# Patient Record
Sex: Female | Born: 1955 | Race: White | Hispanic: No | Smoking: Former smoker
Health system: Southern US, Community
[De-identification: ages and names within clinical notes are randomized; demographics above are authoritative.]

## PROBLEM LIST (undated history)

## (undated) DIAGNOSIS — F419 Anxiety disorder, unspecified: Secondary | ICD-10-CM

## (undated) DIAGNOSIS — F329 Major depressive disorder, single episode, unspecified: Secondary | ICD-10-CM

## (undated) DIAGNOSIS — F32A Depression, unspecified: Secondary | ICD-10-CM

## (undated) HISTORY — DX: Depression, unspecified: F32.A

## (undated) HISTORY — DX: Major depressive disorder, single episode, unspecified: F32.9

## (undated) HISTORY — DX: Anxiety disorder, unspecified: F41.9

---

## 1998-07-01 ENCOUNTER — Other Ambulatory Visit: Admission: RE | Admit: 1998-07-01 | Discharge: 1998-07-01 | Payer: Self-pay | Admitting: Obstetrics and Gynecology

## 2000-02-19 ENCOUNTER — Other Ambulatory Visit: Admission: RE | Admit: 2000-02-19 | Discharge: 2000-02-19 | Payer: Self-pay | Admitting: Obstetrics and Gynecology

## 2001-04-18 ENCOUNTER — Other Ambulatory Visit: Admission: RE | Admit: 2001-04-18 | Discharge: 2001-04-18 | Payer: Self-pay | Admitting: Obstetrics and Gynecology

## 2002-08-10 ENCOUNTER — Other Ambulatory Visit: Admission: RE | Admit: 2002-08-10 | Discharge: 2002-08-10 | Payer: Self-pay | Admitting: Obstetrics and Gynecology

## 2003-08-26 ENCOUNTER — Other Ambulatory Visit: Admission: RE | Admit: 2003-08-26 | Discharge: 2003-08-26 | Payer: Self-pay | Admitting: Obstetrics and Gynecology

## 2006-06-27 ENCOUNTER — Other Ambulatory Visit: Admission: RE | Admit: 2006-06-27 | Discharge: 2006-06-27 | Payer: Self-pay | Admitting: Obstetrics & Gynecology

## 2007-07-03 ENCOUNTER — Other Ambulatory Visit: Admission: RE | Admit: 2007-07-03 | Discharge: 2007-07-03 | Payer: Self-pay | Admitting: Obstetrics & Gynecology

## 2007-11-14 ENCOUNTER — Emergency Department (HOSPITAL_COMMUNITY): Admission: EM | Admit: 2007-11-14 | Discharge: 2007-11-14 | Payer: Self-pay | Admitting: Emergency Medicine

## 2008-07-05 ENCOUNTER — Other Ambulatory Visit: Admission: RE | Admit: 2008-07-05 | Discharge: 2008-07-05 | Payer: Self-pay | Admitting: Internal Medicine

## 2011-05-17 ENCOUNTER — Ambulatory Visit (INDEPENDENT_AMBULATORY_CARE_PROVIDER_SITE_OTHER): Payer: BC Managed Care – PPO | Admitting: Licensed Clinical Social Worker

## 2011-05-17 DIAGNOSIS — F331 Major depressive disorder, recurrent, moderate: Secondary | ICD-10-CM

## 2011-05-17 DIAGNOSIS — F411 Generalized anxiety disorder: Secondary | ICD-10-CM

## 2011-05-24 ENCOUNTER — Ambulatory Visit (INDEPENDENT_AMBULATORY_CARE_PROVIDER_SITE_OTHER): Payer: BC Managed Care – PPO | Admitting: Licensed Clinical Social Worker

## 2011-05-24 DIAGNOSIS — F411 Generalized anxiety disorder: Secondary | ICD-10-CM

## 2011-05-24 DIAGNOSIS — F331 Major depressive disorder, recurrent, moderate: Secondary | ICD-10-CM

## 2011-05-31 ENCOUNTER — Ambulatory Visit (INDEPENDENT_AMBULATORY_CARE_PROVIDER_SITE_OTHER): Payer: BC Managed Care – PPO | Admitting: Licensed Clinical Social Worker

## 2011-05-31 DIAGNOSIS — F411 Generalized anxiety disorder: Secondary | ICD-10-CM

## 2011-05-31 DIAGNOSIS — F331 Major depressive disorder, recurrent, moderate: Secondary | ICD-10-CM

## 2011-06-14 ENCOUNTER — Ambulatory Visit (INDEPENDENT_AMBULATORY_CARE_PROVIDER_SITE_OTHER): Payer: BC Managed Care – PPO | Admitting: Licensed Clinical Social Worker

## 2011-06-14 DIAGNOSIS — F411 Generalized anxiety disorder: Secondary | ICD-10-CM

## 2011-06-14 DIAGNOSIS — F331 Major depressive disorder, recurrent, moderate: Secondary | ICD-10-CM

## 2011-06-21 ENCOUNTER — Ambulatory Visit: Payer: BC Managed Care – PPO | Admitting: Licensed Clinical Social Worker

## 2011-06-25 ENCOUNTER — Ambulatory Visit (INDEPENDENT_AMBULATORY_CARE_PROVIDER_SITE_OTHER): Payer: BC Managed Care – PPO | Admitting: Licensed Clinical Social Worker

## 2011-06-25 DIAGNOSIS — F331 Major depressive disorder, recurrent, moderate: Secondary | ICD-10-CM

## 2011-06-25 DIAGNOSIS — F411 Generalized anxiety disorder: Secondary | ICD-10-CM

## 2011-07-02 ENCOUNTER — Ambulatory Visit (INDEPENDENT_AMBULATORY_CARE_PROVIDER_SITE_OTHER): Payer: BC Managed Care – PPO | Admitting: Licensed Clinical Social Worker

## 2011-07-02 DIAGNOSIS — F411 Generalized anxiety disorder: Secondary | ICD-10-CM

## 2011-07-02 DIAGNOSIS — F331 Major depressive disorder, recurrent, moderate: Secondary | ICD-10-CM

## 2011-07-12 ENCOUNTER — Ambulatory Visit (INDEPENDENT_AMBULATORY_CARE_PROVIDER_SITE_OTHER): Payer: BC Managed Care – PPO | Admitting: Licensed Clinical Social Worker

## 2011-07-12 DIAGNOSIS — F411 Generalized anxiety disorder: Secondary | ICD-10-CM

## 2011-07-12 DIAGNOSIS — F331 Major depressive disorder, recurrent, moderate: Secondary | ICD-10-CM

## 2011-07-23 ENCOUNTER — Ambulatory Visit (INDEPENDENT_AMBULATORY_CARE_PROVIDER_SITE_OTHER): Payer: BC Managed Care – PPO | Admitting: Licensed Clinical Social Worker

## 2011-07-23 DIAGNOSIS — F331 Major depressive disorder, recurrent, moderate: Secondary | ICD-10-CM

## 2011-07-23 DIAGNOSIS — F411 Generalized anxiety disorder: Secondary | ICD-10-CM

## 2011-08-02 ENCOUNTER — Ambulatory Visit (INDEPENDENT_AMBULATORY_CARE_PROVIDER_SITE_OTHER): Payer: BC Managed Care – PPO | Admitting: Licensed Clinical Social Worker

## 2011-08-02 DIAGNOSIS — F331 Major depressive disorder, recurrent, moderate: Secondary | ICD-10-CM

## 2011-08-02 DIAGNOSIS — F411 Generalized anxiety disorder: Secondary | ICD-10-CM

## 2011-08-16 ENCOUNTER — Ambulatory Visit (INDEPENDENT_AMBULATORY_CARE_PROVIDER_SITE_OTHER): Payer: BC Managed Care – PPO | Admitting: Licensed Clinical Social Worker

## 2011-08-16 DIAGNOSIS — F331 Major depressive disorder, recurrent, moderate: Secondary | ICD-10-CM

## 2011-08-16 DIAGNOSIS — F411 Generalized anxiety disorder: Secondary | ICD-10-CM

## 2011-09-02 ENCOUNTER — Ambulatory Visit: Payer: BC Managed Care – PPO | Admitting: Licensed Clinical Social Worker

## 2011-09-02 DIAGNOSIS — F411 Generalized anxiety disorder: Secondary | ICD-10-CM

## 2011-09-02 DIAGNOSIS — F331 Major depressive disorder, recurrent, moderate: Secondary | ICD-10-CM

## 2011-09-08 ENCOUNTER — Ambulatory Visit (INDEPENDENT_AMBULATORY_CARE_PROVIDER_SITE_OTHER): Payer: BC Managed Care – PPO | Admitting: Licensed Clinical Social Worker

## 2011-09-08 DIAGNOSIS — F331 Major depressive disorder, recurrent, moderate: Secondary | ICD-10-CM

## 2011-09-08 DIAGNOSIS — F411 Generalized anxiety disorder: Secondary | ICD-10-CM

## 2011-09-13 LAB — BASIC METABOLIC PANEL
BUN: 8
CO2: 26
Calcium: 8.4
Chloride: 102
Creatinine, Ser: 0.8
GFR calc Af Amer: >60

## 2011-09-13 LAB — DIFFERENTIAL
Basophils Absolute: 0
Basophils Relative: 0
Eosinophils Absolute: 0 — ABNORMAL LOW
Neutro Abs: 3
Neutrophils Relative %: 74

## 2011-09-13 LAB — URINALYSIS, ROUTINE W REFLEX MICROSCOPIC
Hgb urine dipstick: NEGATIVE
Nitrite: NEGATIVE
Protein, ur: NEGATIVE
Specific Gravity, Urine: 1.009
Urobilinogen, UA: 1

## 2011-09-13 LAB — CBC
MCHC: 35.1
MCV: 94.3
Platelets: 121 — ABNORMAL LOW
RDW: 12.2

## 2011-09-28 ENCOUNTER — Ambulatory Visit (INDEPENDENT_AMBULATORY_CARE_PROVIDER_SITE_OTHER): Payer: BC Managed Care – PPO | Admitting: Licensed Clinical Social Worker

## 2011-09-28 DIAGNOSIS — F331 Major depressive disorder, recurrent, moderate: Secondary | ICD-10-CM

## 2011-09-28 DIAGNOSIS — F411 Generalized anxiety disorder: Secondary | ICD-10-CM

## 2011-10-15 ENCOUNTER — Ambulatory Visit (INDEPENDENT_AMBULATORY_CARE_PROVIDER_SITE_OTHER): Payer: BC Managed Care – PPO | Admitting: Licensed Clinical Social Worker

## 2011-10-15 DIAGNOSIS — F331 Major depressive disorder, recurrent, moderate: Secondary | ICD-10-CM

## 2011-10-15 DIAGNOSIS — F411 Generalized anxiety disorder: Secondary | ICD-10-CM

## 2011-10-21 ENCOUNTER — Ambulatory Visit: Payer: BC Managed Care – PPO | Admitting: Licensed Clinical Social Worker

## 2011-10-27 ENCOUNTER — Ambulatory Visit (INDEPENDENT_AMBULATORY_CARE_PROVIDER_SITE_OTHER): Payer: BC Managed Care – PPO | Admitting: Licensed Clinical Social Worker

## 2011-10-27 DIAGNOSIS — F331 Major depressive disorder, recurrent, moderate: Secondary | ICD-10-CM

## 2011-10-27 DIAGNOSIS — F411 Generalized anxiety disorder: Secondary | ICD-10-CM

## 2011-11-15 ENCOUNTER — Ambulatory Visit (INDEPENDENT_AMBULATORY_CARE_PROVIDER_SITE_OTHER): Payer: BC Managed Care – PPO | Admitting: Licensed Clinical Social Worker

## 2011-11-15 DIAGNOSIS — F331 Major depressive disorder, recurrent, moderate: Secondary | ICD-10-CM

## 2011-11-15 DIAGNOSIS — F411 Generalized anxiety disorder: Secondary | ICD-10-CM

## 2011-11-22 ENCOUNTER — Ambulatory Visit (INDEPENDENT_AMBULATORY_CARE_PROVIDER_SITE_OTHER): Payer: BC Managed Care – PPO | Admitting: Licensed Clinical Social Worker

## 2011-11-22 DIAGNOSIS — F411 Generalized anxiety disorder: Secondary | ICD-10-CM

## 2011-11-22 DIAGNOSIS — F331 Major depressive disorder, recurrent, moderate: Secondary | ICD-10-CM

## 2011-12-10 ENCOUNTER — Ambulatory Visit (INDEPENDENT_AMBULATORY_CARE_PROVIDER_SITE_OTHER): Payer: BC Managed Care – PPO | Admitting: Licensed Clinical Social Worker

## 2011-12-10 DIAGNOSIS — F411 Generalized anxiety disorder: Secondary | ICD-10-CM

## 2011-12-10 DIAGNOSIS — F331 Major depressive disorder, recurrent, moderate: Secondary | ICD-10-CM

## 2011-12-20 ENCOUNTER — Ambulatory Visit (INDEPENDENT_AMBULATORY_CARE_PROVIDER_SITE_OTHER): Payer: BC Managed Care – PPO | Admitting: Licensed Clinical Social Worker

## 2011-12-20 DIAGNOSIS — F331 Major depressive disorder, recurrent, moderate: Secondary | ICD-10-CM

## 2011-12-20 DIAGNOSIS — F411 Generalized anxiety disorder: Secondary | ICD-10-CM

## 2012-01-03 ENCOUNTER — Ambulatory Visit (INDEPENDENT_AMBULATORY_CARE_PROVIDER_SITE_OTHER): Payer: BC Managed Care – PPO | Admitting: Licensed Clinical Social Worker

## 2012-01-03 DIAGNOSIS — F411 Generalized anxiety disorder: Secondary | ICD-10-CM

## 2012-01-03 DIAGNOSIS — F331 Major depressive disorder, recurrent, moderate: Secondary | ICD-10-CM

## 2012-01-10 ENCOUNTER — Ambulatory Visit: Payer: BC Managed Care – PPO | Admitting: Licensed Clinical Social Worker

## 2012-01-17 ENCOUNTER — Ambulatory Visit (INDEPENDENT_AMBULATORY_CARE_PROVIDER_SITE_OTHER): Payer: BC Managed Care – PPO | Admitting: Licensed Clinical Social Worker

## 2012-01-17 DIAGNOSIS — F331 Major depressive disorder, recurrent, moderate: Secondary | ICD-10-CM

## 2012-01-17 DIAGNOSIS — F411 Generalized anxiety disorder: Secondary | ICD-10-CM

## 2012-01-31 ENCOUNTER — Ambulatory Visit (INDEPENDENT_AMBULATORY_CARE_PROVIDER_SITE_OTHER): Payer: BC Managed Care – PPO | Admitting: Licensed Clinical Social Worker

## 2012-01-31 DIAGNOSIS — F331 Major depressive disorder, recurrent, moderate: Secondary | ICD-10-CM

## 2012-01-31 DIAGNOSIS — F411 Generalized anxiety disorder: Secondary | ICD-10-CM

## 2012-02-28 ENCOUNTER — Ambulatory Visit (INDEPENDENT_AMBULATORY_CARE_PROVIDER_SITE_OTHER): Payer: BC Managed Care – PPO | Admitting: Licensed Clinical Social Worker

## 2012-02-28 DIAGNOSIS — F331 Major depressive disorder, recurrent, moderate: Secondary | ICD-10-CM

## 2012-02-28 DIAGNOSIS — F411 Generalized anxiety disorder: Secondary | ICD-10-CM

## 2012-03-20 ENCOUNTER — Ambulatory Visit (INDEPENDENT_AMBULATORY_CARE_PROVIDER_SITE_OTHER): Payer: BC Managed Care – PPO | Admitting: Licensed Clinical Social Worker

## 2012-03-20 DIAGNOSIS — F331 Major depressive disorder, recurrent, moderate: Secondary | ICD-10-CM

## 2012-03-20 DIAGNOSIS — F411 Generalized anxiety disorder: Secondary | ICD-10-CM

## 2012-05-29 ENCOUNTER — Ambulatory Visit (INDEPENDENT_AMBULATORY_CARE_PROVIDER_SITE_OTHER): Payer: BC Managed Care – PPO | Admitting: Licensed Clinical Social Worker

## 2012-05-29 DIAGNOSIS — F331 Major depressive disorder, recurrent, moderate: Secondary | ICD-10-CM

## 2012-07-03 ENCOUNTER — Ambulatory Visit (INDEPENDENT_AMBULATORY_CARE_PROVIDER_SITE_OTHER): Payer: BC Managed Care – PPO | Admitting: Licensed Clinical Social Worker

## 2012-07-03 DIAGNOSIS — F331 Major depressive disorder, recurrent, moderate: Secondary | ICD-10-CM

## 2012-07-03 DIAGNOSIS — F411 Generalized anxiety disorder: Secondary | ICD-10-CM

## 2012-07-31 ENCOUNTER — Ambulatory Visit (INDEPENDENT_AMBULATORY_CARE_PROVIDER_SITE_OTHER): Payer: BC Managed Care – PPO | Admitting: Licensed Clinical Social Worker

## 2012-07-31 DIAGNOSIS — F411 Generalized anxiety disorder: Secondary | ICD-10-CM

## 2012-07-31 DIAGNOSIS — F331 Major depressive disorder, recurrent, moderate: Secondary | ICD-10-CM

## 2012-09-04 ENCOUNTER — Ambulatory Visit: Payer: BC Managed Care – PPO | Admitting: Licensed Clinical Social Worker

## 2014-02-22 ENCOUNTER — Encounter: Payer: Self-pay | Admitting: Certified Nurse Midwife

## 2014-02-25 ENCOUNTER — Ambulatory Visit (INDEPENDENT_AMBULATORY_CARE_PROVIDER_SITE_OTHER): Payer: BC Managed Care – PPO | Admitting: Certified Nurse Midwife

## 2014-02-25 ENCOUNTER — Encounter: Payer: Self-pay | Admitting: Certified Nurse Midwife

## 2014-02-25 VITALS — BP 110/72 | HR 67 | Resp 16 | Ht 61.75 in | Wt 119.0 lb

## 2014-02-25 DIAGNOSIS — Z30432 Encounter for removal of intrauterine contraceptive device: Secondary | ICD-10-CM

## 2014-02-25 DIAGNOSIS — N951 Menopausal and female climacteric states: Secondary | ICD-10-CM

## 2014-02-25 DIAGNOSIS — F329 Major depressive disorder, single episode, unspecified: Secondary | ICD-10-CM

## 2014-02-25 DIAGNOSIS — F3289 Other specified depressive episodes: Secondary | ICD-10-CM

## 2014-02-25 DIAGNOSIS — F32A Depression, unspecified: Secondary | ICD-10-CM

## 2014-02-25 DIAGNOSIS — Z01419 Encounter for gynecological examination (general) (routine) without abnormal findings: Secondary | ICD-10-CM

## 2014-02-25 DIAGNOSIS — Z0000 Encounter for general adult medical examination without abnormal findings: Secondary | ICD-10-CM

## 2014-02-25 LAB — POCT URINALYSIS DIPSTICK
BILIRUBIN UA: NEGATIVE
GLUCOSE UA: NEGATIVE
Ketones, UA: NEGATIVE
Leukocytes, UA: NEGATIVE
NITRITE UA: NEGATIVE
Protein, UA: NEGATIVE
RBC UA: NEGATIVE
Urobilinogen, UA: NEGATIVE
pH, UA: 5

## 2014-02-25 NOTE — Patient Instructions (Signed)
EXERCISE AND DIET:  We recommended that you start or continue a regular exercise program for good health. Regular exercise means any activity that makes your heart beat faster and makes you sweat.  We recommend exercising at least 30 minutes per day at least 3 days a week, preferably 4 or 5.  We also recommend a diet low in fat and sugar.  Inactivity, poor dietary choices and obesity can cause diabetes, heart attack, stroke, and kidney damage, among others.    ALCOHOL AND SMOKING:  Women should limit their alcohol intake to no more than 7 drinks/beers/glasses of wine (combined, not each!) per week. Moderation of alcohol intake to this level decreases your risk of breast cancer and liver damage. And of course, no recreational drugs are part of a healthy lifestyle.  And absolutely no smoking or even second hand smoke. Most people know smoking can cause heart and lung diseases, but did you know it also contributes to weakening of your bones? Aging of your skin?  Yellowing of your teeth and nails?  CALCIUM AND VITAMIN D:  Adequate intake of calcium and Vitamin D are recommended.  The recommendations for exact amounts of these supplements seem to change often, but generally speaking 600 mg of calcium (either carbonate or citrate) and 800 units of Vitamin D per day seems prudent. Certain women may benefit from higher intake of Vitamin D.  If you are among these women, your doctor will have told you during your visit.    PAP SMEARS:  Pap smears, to check for cervical cancer or precancers,  have traditionally been done yearly, although recent scientific advances have shown that most women can have pap smears less often.  However, every woman still should have a physical exam from her gynecologist every year. It will include a breast check, inspection of the vulva and vagina to check for abnormal growths or skin changes, a visual exam of the cervix, and then an exam to evaluate the size and shape of the uterus and  ovaries.  And after 56 years of age, a rectal exam is indicated to check for rectal cancers. We will also provide age appropriate advice regarding health maintenance, like when you should have certain vaccines, screening for sexually transmitted diseases, bone density testing, colonoscopy, mammograms, etc.   MAMMOGRAMS:  All women over 40 years old should have a yearly mammogram. Many facilities now offer a "3D" mammogram, which may cost around $50 extra out of pocket. If possible,  we recommend you accept the option to have the 3D mammogram performed.  It both reduces the number of women who will be called back for extra views which then turn out to be normal, and it is better than the routine mammogram at detecting truly abnormal areas.    COLONOSCOPY:  Colonoscopy to screen for colon cancer is recommended for all women at age 50.  We know, you hate the idea of the prep.  We agree, BUT, having colon cancer and not knowing it is worse!!  Colon cancer so often starts as a polyp that can be seen and removed at colonscopy, which can quite literally save your life!  And if your first colonoscopy is normal and you have no family history of colon cancer, most women don't have to have it again for 10 years.  Once every ten years, you can do something that may end up saving your life, right?  We will be happy to help you get it scheduled when you are ready.    Be sure to check your insurance coverage so you understand how much it will cost.  It may be covered as a preventative service at no cost, but you should check your particular policy.     Depression, Adult Depression refers to feeling sad, low, down in the dumps, blue, gloomy, or empty. In general, there are two kinds of depression: Depression that we all experience from time to time because of upsetting life experiences, including the loss of a job or the ending of a relationship (normal sadness or normal grief). This kind of depression is considered normal,  is short lived, and resolves within a few days to 2 weeks. (Depression experienced after the loss of a loved one is called bereavement. Bereavement often lasts longer than 2 weeks but normally gets better with time.) Clinical depression, which lasts longer than normal sadness or normal grief or interferes with your ability to function at home, at work, and in school. It also interferes with your personal relationships. It affects almost every aspect of your life. Clinical depression is an illness. Symptoms of depression also can be caused by conditions other than normal sadness and grief or clinical depression. Examples of these conditions are listed as follows: Physical illness Some physical illnesses, including underactive thyroid gland (hypothyroidism), severe anemia, specific types of cancer, diabetes, uncontrolled seizures, heart and lung problems, strokes, and chronic pain are commonly associated with symptoms of depression. Side effects of some prescription medicine In some people, certain types of prescription medicine can cause symptoms of depression. Substance abuse Abuse of alcohol and illicit drugs can cause symptoms of depression. SYMPTOMS Symptoms of normal sadness and normal grief include the following: Feeling sad or crying for short periods of time. Not caring about anything (apathy). Difficulty sleeping or sleeping too much. No longer able to enjoy the things you used to enjoy. Desire to be by oneself all the time (social isolation). Lack of energy or motivation. Difficulty concentrating or remembering. Change in appetite or weight. Restlessness or agitation. Symptoms of clinical depression include the same symptoms of normal sadness or normal grief and also the following symptoms: Feeling sad or crying all the time. Feelings of guilt or worthlessness. Feelings of hopelessness or helplessness. Thoughts of suicide or the desire to harm yourself (suicidal ideation). Loss of  touch with reality (psychotic symptoms). Seeing or hearing things that are not real (hallucinations) or having false beliefs about your life or the people around you (delusions and paranoia). DIAGNOSIS  The diagnosis of clinical depression usually is based on the severity and duration of the symptoms. Your caregiver also will ask you questions about your medical history and substance use to find out if physical illness, use of prescription medicine, or substance abuse is causing your depression. Your caregiver also may order blood tests. TREATMENT  Typically, normal sadness and normal grief do not require treatment. However, sometimes antidepressant medicine is prescribed for bereavement to ease the depressive symptoms until they resolve. The treatment for clinical depression depends on the severity of your symptoms but typically includes antidepressant medicine, counseling with a mental health professional, or a combination of both. Your caregiver will help to determine what treatment is best for you. Depression caused by physical illness usually goes away with appropriate medical treatment of the illness. If prescription medicine is causing depression, talk with your caregiver about stopping the medicine, decreasing the dose, or substituting another medicine. Depression caused by abuse of alcohol or illicit drugs abuse goes away with abstinence from these substances.  Some adults need professional help in order to stop drinking or using drugs. SEEK IMMEDIATE CARE IF: You have thoughts about hurting yourself or others. You lose touch with reality (have psychotic symptoms). You are taking medicine for depression and have a serious side effect. FOR MORE INFORMATION National Alliance on Mental Illness: www.nami.[employer] of Mental Health: http://www.maynard.net/ Document Released: 11/19/2000 Document Revised: 05/23/2012 Document Reviewed: 02/21/2012 [facility] Patient Information 2014 [place],  [place].    Suicidal Feelings, How to Help Yourself Everyone feels sad or unhappy at times, but depressing thoughts and feelings of hopelessness can lead to thoughts of suicide. It can seem as if life is too tough to handle. If you feel as though you have reached the point where suicide is the only answer, it is time to let someone know immediately.  HOW TO COPE AND PREVENT SUICIDE  Let family, friends, teachers, or counselors know. Get help. Try not to isolate yourself from those who care about you. Even though you may not feel sociable, talk with someone every day. It is best if it is face-to-face. Remember, they will want to help you.  Eat a regularly spaced and well-balanced diet.  Get plenty of rest.  Avoid alcohol and drugs because they will only make you feel worse and may also lower your inhibitions. Remove them from the home. If you are thinking of taking an overdose of your prescribed medicines, give your medicines to someone who can give them to you one day at a time. If you are on antidepressants, let your caregiver know of your feelings so he or she can provide a safer medicine, if that is a concern.  Remove weapons or poisons from your home.  Try to stick to routines. Follow a schedule and remind yourself that you have to keep that schedule every day.  Set some realistic goals and achieve them. Make a list and cross things off as you go. Accomplishments give a sense of worth. Wait until you are feeling better before doing things you find difficult or unpleasant to do.  If you are able, try to start exercising. Even half-hour periods of exercise each day will make you feel better. Getting out in the sun or into nature helps you recover from depression faster. If you have a favorite place to walk, take advantage of that.  Increase safe activities that have always given you pleasure. This may include playing your favorite music, reading a good book, painting a picture, or playing your  favorite instrument. Do whatever takes your mind off your depression.  Keep your living space well-lighted. GET HELP Contact a suicide hotline, crisis center, or local suicide prevention center for help right away. Local centers may include a hospital, clinic, community service organization, social service provider, or health department.  Call your local emergency services (911 in the [place]).  Call a suicide hotline:  1-800-273-TALK (5054931228) in the [place].  1-800-SUICIDE (534)153-8227) in the [place].  548-807-2852 in the [place] for Spanish-speaking counselors.  4-132-440-1UUV 3392796806) in the [place] for TTY users.  Visit the following websites for information and help:  National Suicide Prevention Lifeline: www.suicidepreventionlifeline.org  Hopeline: www.hopeline.com  [employer] for Suicide Prevention: https://www.ayers.com/  For lesbian, gay, bisexual, transgender, or questioning youth, contact [employer]:  4-259-5-G-LOVFIE (228) 851-7929) in the [place].  Www.thetrevorproject.org      In [place], treatment resources are listed in each province with listings available under [employer] for [employer] or similar titles.  Another source for Crisis Centres by [place] is located at http://www.suicideprevention.ca/in-crisis-now/find-a-crisis-centre-now/crisis-centres Document Released: 05/29/2003 Document Revised: 02/14/2012 Document Reviewed: 10/17/2007 [facility] Patient Information 2014 [place], [place].

## 2014-02-25 NOTE — Progress Notes (Signed)
58 y.o. G2P2  Caucasian Fe here for annual exam.  Menopausal with hot flashes, occasional night sweats. Mirena IUD was due for removal in 2013. Patient continues to feel depressed with still no finalization of divorce. Patient has financial constraints for medication use. Patient has tried to wean herself off Wellbutrin due to cost. She really feels she needs to stay on it. She is also taking Zoloft, both medications being managed by PCP. Patient states " I don't care anymore and that is why I did not have my mammogram". No thoughts of suicide "I am too chicken". Patient admits to a good friend support network and church network. Frustrated that divorce process is lasting this long. Has not seen PCP this year due to finances with insurance coverage. Eats well and has contact with children who are older. Unsure if she would use HRT, if that would help with some of the changes she is having. No other issues today.  Patient's last menstrual period was 03/07/2011.          Sexually active: no  The current method of family planning is iud due out 2013.    Exercising: no  exercise Smoker:  no  Health Maintenance: Pap:  07-12-11 neg MMG: 10-23-08 normal Colonoscopy:  none BMD:   none TDaP:  2008 Labs: Poct urine-neg, hgb- Self breast exam: not done   reports that she has quit smoking. She has never used smokeless tobacco. She reports that she drinks about 3.0 ounces of alcohol per week. She reports that she does not use illicit drugs.  Past Medical History  Diagnosis Date  . Depression   . Anxiety     History reviewed. No pertinent past surgical history.  Current Outpatient Prescriptions  Medication Sig Dispense Refill  . buPROPion (WELLBUTRIN XL) 300 MG 24 hr tablet Take 300 mg by mouth as needed.       Marland Kitchen. levonorgestrel (MIRENA) 20 MCG/24HR IUD 1 each by Intrauterine route once.      . sertraline (ZOLOFT) 100 MG tablet Take 100 mg by mouth as needed.        No current facility-administered  medications for this visit.    Family History  Problem Relation Age of Onset  . Hypertension Mother   . Heart disease Mother   . Asthma Father   . Asthma Sister   . COPD Sister   . Heart disease Sister   . Osteoporosis Maternal Grandmother     ROS:  Pertinent items are noted in HPI.  Otherwise, a comprehensive ROS was negative.  Exam:   BP 110/72  Pulse 67  Resp 16  Ht 5' 1.75" (1.568 m)  Wt 119 lb (53.978 kg)  BMI 21.95 kg/m2  LMP 03/07/2011 Height: 5' 1.75" (156.8 cm)  Ht Readings from Last 3 Encounters:  02/25/14 5' 1.75" (1.568 m)    General appearance: alert, cooperative and appears stated age Head: Normocephalic, without obvious abnormality, atraumatic Neck: no adenopathy, supple, symmetrical, trachea midline and thyroid normal to inspection and palpation Lungs: clear to auscultation bilaterally Breasts: normal appearance, no masses or tenderness, No nipple retraction or dimpling, No nipple discharge or bleeding, No axillary or supraclavicular adenopathy Heart: regular rate and rhythm Abdomen: soft, non-tender; no masses,  no organomegaly Extremities: extremities normal, atraumatic, no cyanosis or edema Skin: Skin color, texture, turgor normal. No rashes or lesions Lymph nodes: Cervical, supraclavicular, and axillary nodes normal. No abnormal inguinal nodes palpated Neurologic: Grossly normal   Pelvic: External genitalia:  no lesions  Urethra:  normal appearing urethra with no masses, tenderness or lesions              Bartholin's and Skene's: normal                 Vagina: normal appearing vagina with normal color and discharge, no lesions              Cervix: normal, non tender              Pap taken: yes Bimanual Exam:  Uterus:  normal size, contour, position, consistency, mobility, non-tender and mid position              Adnexa: normal adnexa and no mass, fullness, tenderness               Rectovaginal: Confirms               Anus:  normal  sphincter tone, no lesions  A:  Well Woman with normal exam  ?Menopausal  Contraception Mirena IUD removal was due 2013  Chronic history of depression on medication with PCP management, looking for new PCP or other management  Social stress with divorce progress  P:   Reviewed health and wellness pertinent to exam  Lab FSH, TSH, Vitamin D  Discuss importance of checking status of FSH, and determining if her depression is being increased with menopausal symptoms. Discussed HRT option if indicates menopause. Patient will read information given.  Discussed need for removal. Patient request cost and will schedule. Order in.  Discussed other resources for depression, such as the women's center for counseling, Behavioral health and private MD practice. Patient will consider. Instructed if thoughts of self or other harm to seek ER help or call 911. Patient voiced understanding.  Continue to seek support as needed to finalize issues.  Pap smear as per guidelines   Mammogram yearly stressed, discussed cash discount at [facility] Patient plans to schedule when she can pap smear taken today with HPVHR counseled on breast self exam, mammography screening, use and side effects of HRT, adequate intake of calcium and vitamin D, diet and exercise, discussed risks and benefits of colonoscopy patient declined due to cost, declined IFOB also. Warning signs of problems with rectal bleeding or stool change given.  return annually or prn  An After Visit Summary was printed and given to the patient.

## 2014-02-26 ENCOUNTER — Telehealth: Payer: Self-pay | Admitting: Certified Nurse Midwife

## 2014-02-26 ENCOUNTER — Telehealth: Payer: Self-pay

## 2014-02-26 LAB — FOLLICLE STIMULATING HORMONE: FSH: 72.9 m[IU]/mL

## 2014-02-26 LAB — TSH: TSH: 3.322 u[IU]/mL (ref 0.350–4.500)

## 2014-02-26 LAB — VITAMIN D 25 HYDROXY (VIT D DEFICIENCY, FRACTURES): VIT D 25 HYDROXY: 25 ng/mL — AB (ref 30–89)

## 2014-02-26 NOTE — Telephone Encounter (Signed)
Patient returning Joy's call.  °

## 2014-02-26 NOTE — Telephone Encounter (Signed)
Left message for patient to call back. Need to go over benefits and scheduled iud removal w/Debbi

## 2014-02-26 NOTE — Progress Notes (Signed)
Reviewed personally.  M. Suzanne Rhona Fusilier, MD.  

## 2014-02-26 NOTE — Telephone Encounter (Signed)
lmtcb

## 2014-02-26 NOTE — Telephone Encounter (Signed)
Message copied by Eliezer BottomJOHNSON, Vear Staton J on Tue Feb 26, 2014  1:37 PM ------      Message from: Verner CholLEONARD, DEBORAH S      Created: Tue Feb 26, 2014  1:02 PM       Notify patient that Vitamin D is low, needs to start on 1000 IU Vitamin D 3 daily      TSH is normal      FSH is showing menopausal range, OK to remove IUD. I have placed an order for procedure. Patient will be contacted regarding insurance pre cert and by RN to schedule removal. ------

## 2014-02-26 NOTE — Telephone Encounter (Signed)
Spoke with patient. Advised that per benefits quote received, she will have 0 responsibility for IUD removal. Scheduled procedure.

## 2014-02-26 NOTE — Telephone Encounter (Signed)
Patient returning Sabrina's call. °

## 2014-02-26 NOTE — Telephone Encounter (Signed)
Patient notified of results as written by provider 

## 2014-02-27 LAB — IPS PAP TEST WITH HPV

## 2014-03-19 ENCOUNTER — Encounter: Payer: Self-pay | Admitting: Certified Nurse Midwife

## 2014-03-19 ENCOUNTER — Ambulatory Visit (INDEPENDENT_AMBULATORY_CARE_PROVIDER_SITE_OTHER): Payer: BC Managed Care – PPO | Admitting: Certified Nurse Midwife

## 2014-03-19 VITALS — BP 104/68 | HR 68 | Resp 16 | Ht 61.75 in | Wt 117.0 lb

## 2014-03-19 DIAGNOSIS — Z30432 Encounter for removal of intrauterine contraceptive device: Secondary | ICD-10-CM

## 2014-03-19 NOTE — Progress Notes (Signed)
57 yrsCaucasian  Divorced G2P2002 LMP 2012.     Presents for Mirena IUD removal.  Denies any vaginal symptoms or STD concerns.  Plans for contraception are none needed due to menopause and not sexually active.  [id number]          HPI neg.  Exam: time, place and person Abdomen: soft non-tender Groin:no inguinal nodes palpated    Pelvic exam:Pelvic exam: normal external genitalia, vulva, vagina, cervix, uterus and adnexa, VULVA: normal appearing vulva with no masses, tenderness or lesions, VAGINA: normal appearing vagina with normal color and discharge, no lesions, CERVIX: normal appearing cervix without discharge or lesions IUD string noted in cervix UTERUS: uterus is normal size, shape, consistency and nontender, ADNEXA: normal adnexa in size, nontender and no masses.  Procedure: Speculum placed, cervix visualized.  IUD string visualized, grasp with ring forceps, with gentle traction IUD removed intact.  IUD not shown to patient( patient refused to look at) and discarded. Speculum removed.   Assessment:Mirena removal Pt tolerated procedure well.  Plan: Begin contraceptive choice ofmenopausal none needed, not sexually active   Questions addressed  Return Visit prn, aex

## 2014-03-29 NOTE — Progress Notes (Signed)
Reviewed personally.  M. Suzanne Reign Dziuba, MD.  

## 2014-10-07 ENCOUNTER — Encounter: Payer: Self-pay | Admitting: Certified Nurse Midwife

## 2015-03-04 ENCOUNTER — Ambulatory Visit (INDEPENDENT_AMBULATORY_CARE_PROVIDER_SITE_OTHER): Payer: BC Managed Care – PPO | Admitting: Certified Nurse Midwife

## 2015-03-04 ENCOUNTER — Encounter: Payer: Self-pay | Admitting: Certified Nurse Midwife

## 2015-03-04 VITALS — BP 102/84 | HR 70 | Ht 62.25 in | Wt 116.6 lb

## 2015-03-04 DIAGNOSIS — Z0000 Encounter for general adult medical examination without abnormal findings: Secondary | ICD-10-CM | POA: Diagnosis not present

## 2015-03-04 DIAGNOSIS — B3731 Acute candidiasis of vulva and vagina: Secondary | ICD-10-CM

## 2015-03-04 DIAGNOSIS — Z01419 Encounter for gynecological examination (general) (routine) without abnormal findings: Secondary | ICD-10-CM

## 2015-03-04 DIAGNOSIS — B373 Candidiasis of vulva and vagina: Secondary | ICD-10-CM

## 2015-03-04 LAB — POCT URINALYSIS DIPSTICK
LEUKOCYTES UA: NEGATIVE
Urobilinogen, UA: NEGATIVE
pH, UA: 5

## 2015-03-04 MED ORDER — TERCONAZOLE 0.4 % VA CREA: 1.0000 | TOPICAL_CREAM | Freq: Every day | VAGINAL | Status: DC

## 2015-03-04 NOTE — Progress Notes (Signed)
59 y.o. [id number] Divorced Caucasian Fe here for annual exam.  Menopausal no hot flashes, occasional night sweats.. Denies vaginal bleeding or vaginal dryness. Complaining of ? Yeast infection with vaginal itching and discharge, slight odor also. No new personal products. Not sexually active. Sees PCP for  Medication management/aex/ labs Dr.Warton. No other health issues today.  No LMP recorded.          Sexually active: No.  The current method of family planning is none.    Exercising: Yes.    walking 2x/wk Smoker:  no  Health Maintenance: Pap: 02/25/14 NEG HR HPV negative  MMG: never had one  Colonoscopy: February 2016  polyps F/u in 5 years BMD: never had one    TDaP: 2008  Labs done by PCP ; Urine: Negative    reports that she has quit smoking. She has never used smokeless tobacco. She reports that she drinks about 3.6 oz of alcohol per week. She reports that she does not use illicit drugs.  Past Medical History  Diagnosis Date  . Depression   . Anxiety     No past surgical history on file.  Current Outpatient Prescriptions  Medication Sig Dispense Refill  . buPROPion (WELLBUTRIN XL) 150 MG 24 hr tablet   0  . buPROPion (WELLBUTRIN XL) 300 MG 24 hr tablet Take 300 mg by mouth as needed.     . sertraline (ZOLOFT) 100 MG tablet Take 100 mg by mouth as needed.     . sertraline (ZOLOFT) 50 MG tablet Take 50 mg by mouth daily.  0   No current facility-administered medications for this visit.    Family History  Problem Relation Age of Onset  . Hypertension Mother   . Heart disease Mother   . Asthma Father   . Asthma Sister   . COPD Sister   . Heart disease Sister   . Osteoporosis Maternal Grandmother     ROS:  Pertinent items are noted in HPI.  Otherwise, a comprehensive ROS was negative.  Exam:   BP 102/84 mmHg  Ht 5' 2.25" (1.581 m)  Wt 116 lb 9.6 oz (52.889 kg)  BMI 21.16 kg/m2 Height: 5' 2.25" (158.1 cm) Ht Readings from Last 3 Encounters:  03/04/15 5' 2.25"  (1.581 m)  03/19/14 5' 1.75" (1.568 m)  02/25/14 5' 1.75" (1.568 m)    General appearance: alert, cooperative and appears stated age Head: Normocephalic, without obvious abnormality, atraumatic Neck: no adenopathy, supple, symmetrical, trachea midline and thyroid normal to inspection and palpation Lungs: clear to auscultation bilaterally Breasts: normal appearance, no masses or tenderness, No nipple retraction or dimpling, No nipple discharge or bleeding, No axillary or supraclavicular adenopathy Heart: regular rate and rhythm Abdomen: soft, non-tender; no masses,  no organomegaly Extremities: extremities normal, atraumatic, no cyanosis or edema Skin: Skin color, texture, turgor normal. No rashes or lesions Lymph nodes: Cervical, supraclavicular, and axillary nodes normal. No abnormal inguinal nodes palpated Neurologic: Grossly normal   Pelvic: External genitalia:  no lesions, slight scaling with no exudate              Urethra:  normal appearing urethra with no masses, tenderness or lesions              Bartholin's and Skene's: normal                 Vagina: normal appearing vagina with normal color and white  Thick non odorous discharge, no lesions ph 4.0, wet prep taken  Cervix: normal, non teder no lesions              Pap taken: No. Bimanual Exam:  Uterus:  normal size, contour, position, consistency, mobility, non-tender              Adnexa: normal adnexa and no mass, fullness, tenderness               Rectovaginal: Confirms               Anus:  normal sphincter tone, no lesions  Wet prep positive for yeast  Chaperone present: Yes  A:  Well Woman with normal exam  Menopausal no HRT  Yeast vaginitis  Mammogram  overdue  P:   Reviewed health and wellness pertinent to exam  Aware of need to evaluate if vaginal bleeding  Reviewed  finding of yeast vaginitis and etiology  Rx Terazol 7 vaginal cream see order  Stressed mammogram importance and given information  to schedule. Patient plans to have mammogram.  Pap smear not taken today  counseled on breast self exam, mammography screening, adequate intake of calcium and vitamin D, diet and exercise  return annually or prn  An After Visit Summary was printed and given to the patient.

## 2015-03-04 NOTE — Patient Instructions (Signed)
EXERCISE AND DIET:  We recommended that you start or continue a regular exercise program for good health. Regular exercise means any activity that makes your heart beat faster and makes you sweat.  We recommend exercising at least 30 minutes per day at least 3 days a week, preferably 4 or 5.  We also recommend a diet low in fat and sugar.  Inactivity, poor dietary choices and obesity can cause diabetes, heart attack, stroke, and kidney damage, among others.    ALCOHOL AND SMOKING:  Women should limit their alcohol intake to no more than 7 drinks/beers/glasses of wine (combined, not each!) per week. Moderation of alcohol intake to this level decreases your risk of breast cancer and liver damage. And of course, no recreational drugs are part of a healthy lifestyle.  And absolutely no smoking or even second hand smoke. Most people know smoking can cause heart and lung diseases, but did you know it also contributes to weakening of your bones? Aging of your skin?  Yellowing of your teeth and nails?  CALCIUM AND VITAMIN D:  Adequate intake of calcium and Vitamin D are recommended.  The recommendations for exact amounts of these supplements seem to change often, but generally speaking 600 mg of calcium (either carbonate or citrate) and 800 units of Vitamin D per day seems prudent. Certain women may benefit from higher intake of Vitamin D.  If you are among these women, your doctor will have told you during your visit.    PAP SMEARS:  Pap smears, to check for cervical cancer or precancers,  have traditionally been done yearly, although recent scientific advances have shown that most women can have pap smears less often.  However, every woman still should have a physical exam from her gynecologist every year. It will include a breast check, inspection of the vulva and vagina to check for abnormal growths or skin changes, a visual exam of the cervix, and then an exam to evaluate the size and shape of the uterus and  ovaries.  And after 57 years of age, a rectal exam is indicated to check for rectal cancers. We will also provide age appropriate advice regarding health maintenance, like when you should have certain vaccines, screening for sexually transmitted diseases, bone density testing, colonoscopy, mammograms, etc.   MAMMOGRAMS:  All women over 40 years old should have a yearly mammogram. Many facilities now offer a "3D" mammogram, which may cost around $50 extra out of pocket. If possible,  we recommend you accept the option to have the 3D mammogram performed.  It both reduces the number of women who will be called back for extra views which then turn out to be normal, and it is better than the routine mammogram at detecting truly abnormal areas.    COLONOSCOPY:  Colonoscopy to screen for colon cancer is recommended for all women at age 50.  We know, you hate the idea of the prep.  We agree, BUT, having colon cancer and not knowing it is worse!!  Colon cancer so often starts as a polyp that can be seen and removed at colonscopy, which can quite literally save your life!  And if your first colonoscopy is normal and you have no family history of colon cancer, most women don't have to have it again for 10 years.  Once every ten years, you can do something that may end up saving your life, right?  We will be happy to help you get it scheduled when you are ready.    Be sure to check your insurance coverage so you understand how much it will cost.  It may be covered as a preventative service at no cost, but you should check your particular policy.     Monilial Vaginitis Vaginitis in a soreness, swelling and redness (inflammation) of the vagina and vulva. Monilial vaginitis is not a sexually transmitted infection. CAUSES  Yeast vaginitis is caused by yeast (candida) that is normally found in your vagina. With a yeast infection, the candida has overgrown in number to a point that upsets the chemical balance. SYMPTOMS    White, thick vaginal discharge.  Swelling, itching, redness and irritation of the vagina and possibly the lips of the vagina (vulva).  Burning or painful urination.  Painful intercourse. DIAGNOSIS  Things that may contribute to monilial vaginitis are:  Postmenopausal and virginal states.  Pregnancy.  Infections.  Being tired, sick or stressed, especially if you had monilial vaginitis in the past.  Diabetes. Good control will help lower the chance.  Birth control pills.  Tight fitting garments.  Using bubble bath, feminine sprays, douches or deodorant tampons.  Taking certain medications that kill germs (antibiotics).  Sporadic recurrence can occur if you become ill. TREATMENT  Your caregiver will give you medication.  There are several kinds of anti monilial vaginal creams and suppositories specific for monilial vaginitis. For recurrent yeast infections, use a suppository or cream in the vagina 2 times a week, or as directed.  Anti-monilial or steroid cream for the itching or irritation of the vulva may also be used. Get your caregiver's permission.  Painting the vagina with methylene blue solution may help if the monilial cream does not work.  Eating yogurt may help prevent monilial vaginitis. HOME CARE INSTRUCTIONS   Finish all medication as prescribed.  Do not have sex until treatment is completed or after your caregiver tells you it is okay.  Take warm sitz baths.  Do not douche.  Do not use tampons, especially scented ones.  Wear cotton underwear.  Avoid tight pants and panty hose.  Tell your sexual partner that you have a yeast infection. They should go to their caregiver if they have symptoms such as mild rash or itching.  Your sexual partner should be treated as well if your infection is difficult to eliminate.  Practice safer sex. Use condoms.  Some vaginal medications cause latex condoms to fail. Vaginal medications that harm condoms  are:  Cleocin cream.  Butoconazole (Femstat).  Terconazole (Terazol) vaginal suppository.  Miconazole (Monistat) (may be purchased over the counter). SEEK MEDICAL CARE IF:   You have a temperature by mouth above 102 F (38.9 C).  The infection is getting worse after 2 days of treatment.  The infection is not getting better after 3 days of treatment.  You develop blisters in or around your vagina.  You develop vaginal bleeding, and it is not your menstrual period.  You have pain when you urinate.  You develop intestinal problems.  You have pain with sexual intercourse. Document Released: 09/01/2005 Document Revised: 02/14/2012 Document Reviewed: 05/16/2009 Exit[employer] Patient Information 2015 [employer]. This information is not intended to replace advice given to you by your health care provider. Make sure you discuss any questions you have with your health care provider.  

## 2015-03-05 NOTE — Progress Notes (Signed)
Reviewed personally.  M. Suzanne Coby Antrobus, MD.  

## 2016-03-23 ENCOUNTER — Ambulatory Visit (INDEPENDENT_AMBULATORY_CARE_PROVIDER_SITE_OTHER): Payer: BC Managed Care – PPO | Admitting: Certified Nurse Midwife

## 2016-03-23 ENCOUNTER — Encounter: Payer: Self-pay | Admitting: Certified Nurse Midwife

## 2016-03-23 VITALS — BP 108/70 | HR 72 | Resp 16 | Ht 61.75 in | Wt 123.0 lb

## 2016-03-23 DIAGNOSIS — Z0000 Encounter for general adult medical examination without abnormal findings: Secondary | ICD-10-CM

## 2016-03-23 DIAGNOSIS — Z124 Encounter for screening for malignant neoplasm of cervix: Secondary | ICD-10-CM | POA: Diagnosis not present

## 2016-03-23 DIAGNOSIS — Z01419 Encounter for gynecological examination (general) (routine) without abnormal findings: Secondary | ICD-10-CM

## 2016-03-23 LAB — POCT URINALYSIS DIPSTICK
BILIRUBIN UA: NEGATIVE
Blood, UA: NEGATIVE
GLUCOSE UA: NEGATIVE
Ketones, UA: NEGATIVE
LEUKOCYTES UA: NEGATIVE
NITRITE UA: NEGATIVE
PH UA: 5
Protein, UA: NEGATIVE
Urobilinogen, UA: NEGATIVE

## 2016-03-23 NOTE — Patient Instructions (Addendum)

## 2016-03-23 NOTE — Progress Notes (Signed)
60 y.o. [id number]  Caucasian Fe here for annual exam. Menopausal no HRT, no hot flashes or night sweats. Denies vaginal bleeding or vaginal dryness.  Sees PCP for aex/labs and anti depression medication management. No change in dosage in past year. Had colonoscopy with polyps noted. Will need repeat in 5 years. Has yet to have mammogram, forget to schedule! 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  Patient's last menstrual period was 03/07/2011.          Sexually active: No.  The current method of family planning is post menopausal status.    Exercising: Yes.    walking Smoker:  no  Health Maintenance: Pap:  02-25-14 neg HPV HR neg MMG:  2009 normal, Colonoscopy:  2016 polyps f/u 5 yrs  BMD:   never TDaP:  2008 Shingles: no Pneumonia: no Hep C and HIV: not done Labs: poct urine-neg Self breast exam: done occ   reports that she has quit smoking. She has never used smokeless tobacco. She reports that she drinks about 3.0 - 4.2 oz of alcohol per week. She reports that she does not use illicit drugs.  Past Medical History  Diagnosis Date  . Depression   . Anxiety     History reviewed. No pertinent past surgical history.  Current Outpatient Prescriptions  Medication Sig Dispense Refill  . buPROPion (WELLBUTRIN XL) 150 MG 24 hr tablet   0  . sertraline (ZOLOFT) 50 MG tablet Take 50 mg by mouth daily.  0   No current facility-administered medications for this visit.    Family History  Problem Relation Age of Onset  . Hypertension Mother   . Heart disease Mother   . Asthma Father   . Asthma Sister   . COPD Sister   . Heart disease Sister   . Osteoporosis Maternal Grandmother     ROS:  Pertinent items are noted in HPI.  Otherwise, a comprehensive ROS was negative.  Exam:   BP 108/70 mmHg  Pulse 72  Resp 16  Ht 5' 1.75" (1.568 m)  Wt 123 lb (55.792 kg)  BMI 22.69 kg/m2  LMP 03/07/2011 Height: 5' 1.75" (156.8 cm) Ht Readings from Last 3 Encounters:  03/23/16 5'  1.75" (1.568 m)  03/04/15 5' 2.25" (1.581 m)  03/19/14 5' 1.75" (1.568 m)    General appearance: alert, cooperative and appears stated age Head: Normocephalic, without obvious abnormality, atraumatic Neck: no adenopathy, supple, symmetrical, trachea midline and thyroid normal to inspection and palpation Lungs: clear to auscultation bilaterally Breasts: normal appearance, no masses or tenderness, No nipple retraction or dimpling, No nipple discharge or bleeding, No axillary or supraclavicular adenopathy Heart: regular rate and rhythm Abdomen: soft, non-tender; no masses,  no organomegaly Extremities: extremities normal, atraumatic, no cyanosis or edema Skin: Skin color, texture, turgor normal. No rashes or lesions Lymph nodes: Cervical, supraclavicular, and axillary nodes normal. No abnormal inguinal nodes palpated Neurologic: Grossly normal   Pelvic: External genitalia:  no lesions              Urethra:  normal appearing urethra with no masses, tenderness or lesions              Bartholin's and Skene's: normal                 Vagina: normal appearing vagina with normal color and discharge, no lesions              Cervix: normal,non tender,no lesions  Pap taken: Yes.   Bimanual Exam:  Uterus:  normal size, contour, position, consistency, mobility, non-tender and anteverted              Adnexa: normal adnexa and no mass, fullness, tenderness               Rectovaginal: Confirms               Anus:  normal sphincter tone, no lesions  Chaperone present: yes  A:  Well Woman with normal exam  Menopausal no HRT  Depression with PCP management, stable medication  Mammogram overdue  P:   Reviewed health and wellness pertinent to exam  Aware of need to evaluate if vaginal bleeding  Continue follow up with PCP as needed   Patient will be scheduled for mammogram prior to leaving today  Pap smear as above with HPV reflex   counseled on breast self exam, mammography  screening, adequate intake of calcium and vitamin D, diet and exercise  return annually or prn  An After Visit Summary was printed and given to the patient.

## 2016-03-24 LAB — IPS PAP TEST WITH REFLEX TO HPV

## 2016-03-25 NOTE — Progress Notes (Signed)
Encounter reviewed Sharri Loya, MD   

## 2017-03-24 ENCOUNTER — Ambulatory Visit: Payer: BC Managed Care – PPO | Admitting: Certified Nurse Midwife

## 2017-03-31 ENCOUNTER — Encounter: Payer: Self-pay | Admitting: Certified Nurse Midwife

## 2017-03-31 ENCOUNTER — Ambulatory Visit (INDEPENDENT_AMBULATORY_CARE_PROVIDER_SITE_OTHER): Payer: BC Managed Care – PPO | Admitting: Certified Nurse Midwife

## 2017-03-31 VITALS — BP 120/78 | HR 70 | Resp 16 | Ht 61.75 in | Wt 121.0 lb

## 2017-03-31 DIAGNOSIS — Z124 Encounter for screening for malignant neoplasm of cervix: Secondary | ICD-10-CM

## 2017-03-31 DIAGNOSIS — Z01419 Encounter for gynecological examination (general) (routine) without abnormal findings: Secondary | ICD-10-CM

## 2017-03-31 DIAGNOSIS — N952 Postmenopausal atrophic vaginitis: Secondary | ICD-10-CM | POA: Diagnosis not present

## 2017-03-31 DIAGNOSIS — Z23 Encounter for immunization: Secondary | ICD-10-CM | POA: Diagnosis not present

## 2017-03-31 NOTE — Patient Instructions (Signed)

## 2017-03-31 NOTE — Progress Notes (Signed)
61 y.o. [id number] Divorced  Caucasian Fe here for annual exam. Menopausal no HRT,. Denies vaginal bleeding or vaginal dryness. Not sexually active. Sees PCP for labs/aex and depression management medications. All stable at present. Busy with substitute teaching. No health issues today.  Patient's last menstrual period was 03/07/2011.          Sexually active: No.  The current method of family planning is post menopausal status.    Exercising: Yes.    walking Smoker:  no  Health Maintenance: Pap:  02-25-14 neg HPV HR neg, 03-23-16 neg History of Abnormal Pap: no MMG:  03-30-17 no result yet Self Breast exams: occ Colonoscopy:  2016 polyps f/u 32yrs BMD:   none TDaP:  2008 will do today Shingles: no Pneumonia: no Hep C and HIV: Hep c neg 12/17 Labs: none   reports that she has quit smoking. She has never used smokeless tobacco. She reports that she drinks about 3.0 oz of alcohol per week . She reports that she does not use drugs.  Past Medical History:  Diagnosis Date  . Anxiety   . Depression     History reviewed. No pertinent surgical history.  Current Outpatient Prescriptions  Medication Sig Dispense Refill  . buPROPion (WELLBUTRIN XL) 150 MG 24 hr tablet   0  . sertraline (ZOLOFT) 50 MG tablet Take 50 mg by mouth daily.  0   No current facility-administered medications for this visit.     Family History  Problem Relation Age of Onset  . Hypertension Mother   . Heart disease Mother   . Asthma Father   . Asthma Sister   . COPD Sister   . Heart disease Sister   . Osteoporosis Maternal Grandmother     ROS:  Pertinent items are noted in HPI.  Otherwise, a comprehensive ROS was negative.  Exam:   BP 120/78   Pulse 70   Resp 16   Ht 5' 1.75" (1.568 m)   Wt 121 lb (54.9 kg)   LMP 03/07/2011   BMI 22.31 kg/m  Height: 5' 1.75" (156.8 cm) Ht Readings from Last 3 Encounters:  03/31/17 5' 1.75" (1.568 m)  03/23/16 5' 1.75" (1.568 m)  03/04/15 5' 2.25" (1.581 m)     General appearance: alert, cooperative and appears stated age Head: Normocephalic, without obvious abnormality, atraumatic Neck: no adenopathy, supple, symmetrical, trachea midline and thyroid normal to inspection and palpation and non-palpable Lungs: clear to auscultation bilaterally Breasts: normal appearance, no masses or tenderness, No nipple retraction or dimpling, No nipple discharge or bleeding, No axillary or supraclavicular adenopathy Heart: regular rate and rhythm Abdomen: soft, non-tender; no masses,  no organomegaly Extremities: extremities normal, atraumatic, no cyanosis or edema Skin: Skin color, texture, turgor normal. No rashes or lesions Lymph nodes: Cervical, supraclavicular, and axillary nodes normal. No abnormal inguinal nodes palpated Neurologic: Grossly normal   Pelvic: External genitalia:  no lesions              Urethra:  normal appearing urethra with no masses, tenderness or lesions              Bartholin's and Skene's: normal                 Vagina: normal atrophic appearing vagina with normal color and discharge, no lesions              Cervix: no bleeding following Pap, no cervical motion tenderness and no lesions  Pap taken: Yes.   Bimanual Exam:  Uterus:  normal size, contour, position, consistency, mobility, non-tender              Adnexa: normal adnexa and no mass, fullness, tenderness               Rectovaginal: Confirms               Anus:  normal sphincter tone, no lesions  Chaperone present: yes  A:  Well Woman with normal exam  Menopausal no HRT.  Vaginal dryness/atrophy  Immunization due  Anxiety/Depression with PCP management  P:   Reviewed health and wellness pertinent to exam  Aware of need to evaluate if vaginal bleeding  Discussed vaginal dryness finding and increase risk of UTI/ vaginal infection and pain with sexual activity with dryness/atrophy. Discussed etiology and treatment options. Patient does not want to use  estrogen products. Discussed OTC coconut oil with instructions to use twice weekly. Advise if problems with use. Questions addressed.  Requests TDAP.  Continue PCP follow up as indicated.  Pap smear: yes   counseled on breast self exam, mammography screening, STD prevention, HIV risk factors and prevention, adequate intake of calcium and vitamin D, diet and exercise  return annually or prn  An After Visit Summary was printed and given to the patient.

## 2017-03-31 NOTE — Progress Notes (Signed)
Encounter reviewed Bee Hammerschmidt, MD   

## 2017-04-02 LAB — IPS PAP TEST WITH REFLEX TO HPV

## 2017-04-07 ENCOUNTER — Encounter: Payer: Self-pay | Admitting: Certified Nurse Midwife

## 2017-12-30 ENCOUNTER — Emergency Department (HOSPITAL_COMMUNITY)
Admission: EM | Admit: 2017-12-30 | Discharge: 2017-12-31 | Disposition: A | Discharge status: Home / Self Care | Payer: BC Managed Care – PPO | Attending: Emergency Medicine | Admitting: Emergency Medicine

## 2017-12-30 ENCOUNTER — Other Ambulatory Visit: Payer: Self-pay

## 2017-12-30 ENCOUNTER — Encounter (HOSPITAL_COMMUNITY): Payer: Self-pay

## 2017-12-30 ENCOUNTER — Emergency Department (HOSPITAL_COMMUNITY): Payer: BC Managed Care – PPO

## 2017-12-30 DIAGNOSIS — S52614A Nondisplaced fracture of right ulna styloid process, initial encounter for closed fracture: Secondary | ICD-10-CM

## 2017-12-30 DIAGNOSIS — Y9301 Activity, walking, marching and hiking: Secondary | ICD-10-CM | POA: Insufficient documentation

## 2017-12-30 DIAGNOSIS — Z79899 Other long term (current) drug therapy: Secondary | ICD-10-CM | POA: Diagnosis not present

## 2017-12-30 DIAGNOSIS — S52571A Other intraarticular fracture of lower end of right radius, initial encounter for closed fracture: Secondary | ICD-10-CM | POA: Diagnosis not present

## 2017-12-30 DIAGNOSIS — Y929 Unspecified place or not applicable: Secondary | ICD-10-CM | POA: Diagnosis not present

## 2017-12-30 DIAGNOSIS — Z87891 Personal history of nicotine dependence: Secondary | ICD-10-CM | POA: Insufficient documentation

## 2017-12-30 DIAGNOSIS — Y998 Other external cause status: Secondary | ICD-10-CM | POA: Insufficient documentation

## 2017-12-30 DIAGNOSIS — W108XXA Fall (on) (from) other stairs and steps, initial encounter: Secondary | ICD-10-CM | POA: Insufficient documentation

## 2017-12-30 DIAGNOSIS — S6991XA Unspecified injury of right wrist, hand and finger(s), initial encounter: Secondary | ICD-10-CM | POA: Diagnosis present

## 2017-12-30 MED ORDER — LIDOCAINE HCL (PF) 2 % IJ SOLN: 10.0000 mL | Freq: Once | INTRAMUSCULAR | Status: DC

## 2017-12-30 MED ORDER — OXYCODONE-ACETAMINOPHEN 5-325 MG PO TABS
1.0000 | ORAL_TABLET | Freq: Once | ORAL | Status: AC
  Administered 2017-12-31: 1 via ORAL
  Filled 2017-12-30: qty 1

## 2017-12-30 NOTE — ED Notes (Signed)
Bed: [facility]1 Expected date:  Expected time:  Means of arrival:  Comments: 

## 2017-12-30 NOTE — ED Provider Notes (Signed)
[facility]-EMERGENCY DEPT Provider Note   CSN: [id number] Arrival date & time: 12/30/17  2204     History   Chief Complaint Chief Complaint  Patient presents with  . Wrist Pain    R    HPI Leslie Herrera is a 62 y.o. female.  62 year old female presents to the emergency department for evaluation of right wrist pain.  She is right-hand dominant and reports tripping while going down some stairs taking the trash out.  She landed on her outstretched hand.  She reports constant pain since the fall which has been worsening since onset.  Pain aggravated with movement.  She has had some improvement with constant icing.  No history of prior wrist injury.  No numbness or paresthesias.     Past Medical History:  Diagnosis Date  . Anxiety   . Depression     Patient Active Problem List   Diagnosis Date Noted  . Depression 02/25/2014    Class: History of    History reviewed. No pertinent surgical history.  OB History    Gravida Para Term Preterm AB Living   2 2 2     2    SAB TAB Ectopic Multiple Live Births           2       Home Medications    Prior to Admission medications   Medication Sig Start Date End Date Taking? Authorizing Provider  buPROPion (WELLBUTRIN XL) 150 MG 24 hr tablet  12/09/14   [provider]  oxyCODONE-acetaminophen (PERCOCET/ROXICET) 5-325 MG tablet Take 1 tablet by mouth every 6 (six) hours as needed for severe pain. 12/31/17   Antony MaduraHumes, Aniyla Harling, PA-C  sertraline (ZOLOFT) 50 MG tablet Take 50 mg by mouth daily. 12/09/14   [provider]    Family History Family History  Problem Relation Age of Onset  . Hypertension Mother   . Heart disease Mother   . Asthma Father   . Asthma Sister   . COPD Sister   . Heart disease Sister   . Osteoporosis Maternal Grandmother     Social History Social History   Tobacco Use  . Smoking status: Former Games developermoker  . Smokeless tobacco: Never Used  . Tobacco comment: college yrs  off & on  Substance Use Topics  . Alcohol use: Yes    Alcohol/week: 3.0 oz    Types: 5 Glasses of wine per week  . Drug use: No     Allergies   Patient has no known allergies.   Review of Systems Review of Systems Ten systems reviewed and are negative for acute change, except as noted in the HPI.    Physical Exam Updated Vital Signs BP 126/86 (BP Location: Left Arm)   Pulse 73   Temp 98.1 F (36.7 C) (Oral)   Resp 20   Ht 5\' 1"  (1.549 m)   Wt 54.4 kg (120 lb)   LMP 03/07/2011   SpO2 100%   BMI 22.67 kg/m   Physical Exam  Constitutional: She is oriented to person, place, and time. She appears well-developed and well-nourished. No distress.  Nontoxic appearing and in no acute distress  HENT:  Head: Normocephalic and atraumatic.  Eyes: Conjunctivae and EOM are normal. No scleral icterus.  Neck: Normal range of motion.  Cardiovascular: Normal rate, regular rhythm and intact distal pulses.  Distal radial pulse 2+ in the right upper extremity.  Capillary refill brisk in all digits of the right hand.  Pulmonary/Chest: Effort normal.  No stridor. No respiratory distress.  Musculoskeletal: She exhibits deformity.       Right wrist: She exhibits decreased range of motion, tenderness, bony tenderness, swelling and deformity. She exhibits no laceration.  Neurological: She is alert and oriented to person, place, and time. She exhibits normal muscle tone. Coordination normal.  Sensation to light touch intact in the distal tips of all fingers of the right hand.  Patient able to wiggle fingers.  Skin: Skin is warm and dry. No rash noted. She is not diaphoretic. No erythema. No pallor.  Psychiatric: She has a normal mood and affect. Her behavior is normal.  Nursing note and vitals reviewed.    ED Treatments / Results  Labs (all labs ordered are listed, but only abnormal results are displayed) Labs Reviewed - No data to display  EKG  EKG Interpretation None        Radiology Dg Wrist Complete Right  Result Date: 12/30/2017 CLINICAL DATA:  Wrist pain after fall EXAM: RIGHT WRIST - COMPLETE 3+ VIEW COMPARISON:  None. FINDINGS: Comminuted impacted intra-articular fracture of the distal radius with about 1/4 shaft diameter of dorsal displacement of distal fracture fragment. Additional comminuted mildly displaced ulnar styloid process fracture. IMPRESSION: 1. Comminuted, impacted and mildly displaced intra-articular distal radius fracture 2. Comminuted, mildly displaced ulnar styloid process fracture Electronically Signed   By: Jasmine Pang M.D.   On: 12/30/2017 23:59    Procedures Procedures (including critical care time)  Medications Ordered in ED Medications  oxyCODONE-acetaminophen (PERCOCET/ROXICET) 5-325 MG per tablet 1 tablet (1 tablet Oral Given 12/31/17 0034)     Initial Impression / Assessment and Plan / ED Course  I have reviewed the triage vital signs and the nursing notes.  Pertinent labs & imaging results that were available during my care of the patient were reviewed by me and considered in my medical decision making (see chart for details).     Patient presents to the emergency department for evaluation of right wrist pain. Patient neurovascularly intact on exam. Imaging positive for intraarticular distal radius fracture. Sugar tong splint applied. Plan for discharge with RICE instruction and pain medication. Referral given to Dr. Amanda Pea for follow up. Return precautions discussed and provided. Patient discharged in stable condition with no unaddressed concerns.   Final Clinical Impressions(s) / ED Diagnoses   Final diagnoses:  Other closed intra-articular fracture of distal end of right radius, initial encounter  Closed nondisplaced fracture of styloid process of right ulna, initial encounter    ED Discharge Orders        Ordered    oxyCODONE-acetaminophen (PERCOCET/ROXICET) 5-325 MG tablet  Every 6 hours PRN     12/31/17  0106       Antony Madura, PA-C 12/31/17 0224    Palumbo, April, MD 12/31/17 1610

## 2017-12-30 NOTE — ED Triage Notes (Signed)
Pt reports that she tripped going down some stairs tonight. She reports landing on her R wrist when trying to catch herself. Deformity noted. CMS intact.

## 2017-12-31 MED ORDER — OXYCODONE-ACETAMINOPHEN 5-325 MG PO TABS: 1.0000 | ORAL_TABLET | Freq: Four times a day (QID) | ORAL | 0 refills | Status: DC | PRN

## 2017-12-31 NOTE — Discharge Instructions (Signed)
Take Tylenol or ibuprofen for pain control.  If pain is severe, you may take Percocet as prescribed; however, do not take Percocet when taking Tylenol as there is already Tylenol in this medication.  Do not drive a motor vehicle or drink alcohol after taking Percocet as this medication may make you drowsy and impair your judgment.  Continue to ice your injury over top of your splint 3-4 times per day for 15-20 minutes each time. Continue with rest and elevation. Follow up with Dr. Amanda PeaGramig within the next week.

## 2018-04-05 ENCOUNTER — Other Ambulatory Visit: Payer: Self-pay

## 2018-04-05 ENCOUNTER — Encounter: Payer: Self-pay | Admitting: Certified Nurse Midwife

## 2018-04-05 ENCOUNTER — Ambulatory Visit: Payer: BC Managed Care – PPO | Admitting: Certified Nurse Midwife

## 2018-04-05 VITALS — BP 126/74 | HR 80 | Resp 12 | Ht 61.5 in | Wt 129.4 lb

## 2018-04-05 DIAGNOSIS — N951 Menopausal and female climacteric states: Secondary | ICD-10-CM | POA: Diagnosis not present

## 2018-04-05 DIAGNOSIS — Z01419 Encounter for gynecological examination (general) (routine) without abnormal findings: Secondary | ICD-10-CM

## 2018-04-05 DIAGNOSIS — S6291XA Unspecified fracture of right wrist and hand, initial encounter for closed fracture: Secondary | ICD-10-CM | POA: Insufficient documentation

## 2018-04-05 NOTE — Progress Notes (Signed)
62 y.o. G31P2002 Divorced  Caucasian Fe here for annual exam. Menopausal no HRT. Denies vaginal bleeding and some vaginal dryness but using  coconut oil with some success.Leslie Herrera a fall in 1/19 and broke right hand with surgery and long term rehab and OT for a few more weeks. Patient feels she is making good progress. Back to substituting at school. Sees PA C.Wharton for aex and labs and management of depression medication dosage. No other health issues today.  Patient's last menstrual period was 03/07/2011.          Sexually active: No.  The current method of family planning is post menopausal status.    Exercising: Yes.    walk, yoga Smoker:  Former smoker  Health Maintenance: Pap:  02-25-14 neg HPV HR neg, 03/31/17 negative  History of Abnormal Pap: no MMG:  03/30/17 BIRADS 1 negative, needs to schedule Self Breast exams: yes Colonoscopy:  2016, polyps f/u 5 years  BMD:   None TDaP:  03/31/17  Shingles: no Pneumonia: no Hep C and HIV: Hep c negative 12/17  Labs: PCP   reports that she has quit smoking. She has never used smokeless tobacco. She reports that she drinks about 3.0 oz of alcohol per week. She reports that she does not use drugs.  Past Medical History:  Diagnosis Date  . Anxiety   . Depression     History reviewed. No pertinent surgical history.  Current Outpatient Medications  Medication Sig Dispense Refill  . buPROPion (WELLBUTRIN XL) 150 MG 24 hr tablet   0  . sertraline (ZOLOFT) 50 MG tablet Take 50 mg by mouth daily.  0   No current facility-administered medications for this visit.     Family History  Problem Relation Age of Onset  . Hypertension Mother   . Heart disease Mother   . Asthma Father   . Asthma Sister   . COPD Sister   . Heart disease Sister   . Osteoporosis Maternal Grandmother     ROS:  Pertinent items are noted in HPI.  Otherwise, a comprehensive ROS was negative.  Exam:   BP 126/74 (BP Location: Right Arm, Patient Position: Sitting,  Cuff Size: Normal)   Pulse 80   Resp 12   Ht 5' 1.5" (1.562 m)   Wt 129 lb 6.4 oz (58.7 kg)   LMP 03/07/2011   BMI 24.05 kg/m  Height: 5' 1.5" (156.2 cm) Ht Readings from Last 3 Encounters:  04/05/18 5' 1.5" (1.562 m)  12/31/17  (1.549 m)  03/31/17 5' 1.75" (1.568 m)    General appearance: alert, cooperative and appears stated age Head: Normocephalic, without obvious abnormality, atraumatic Neck: no adenopathy, supple, symmetrical, trachea midline and thyroid normal to inspection and palpation Lungs: clear to auscultation bilaterally Breasts: normal appearance, no masses or tenderness, No nipple retraction or dimpling, No nipple discharge or bleeding, No axillary or supraclavicular adenopathy Heart: regular rate and rhythm Abdomen: soft, non-tender; no masses,  no organomegaly Extremities: extremities normal, atraumatic, no cyanosis or edema Skin: Skin color, texture, turgor normal. No rashes or lesions Lymph nodes: Cervical, supraclavicular, and axillary nodes normal. No abnormal inguinal nodes palpated Neurologic: Grossly normal   Pelvic: External genitalia:  no lesions              Urethra:  normal appearing urethra with no masses, tenderness or lesions              Bartholin's and Skene's: normal  Vagina: normal appearing vagina with normal color and discharge, no lesions              Cervix: no cervical motion tenderness, no lesions and normal appearance              Pap taken: No. Bimanual Exam:  Uterus:  normal size, contour, position, consistency, mobility, non-tender              Adnexa: normal adnexa and no mass, fullness, tenderness               Rectovaginal: Confirms               Anus:  normal sphincter tone, no lesions  Chaperone present: yes  A:  Well Woman with normal exam  Menopausal no HRT  Vaginal dryness with coconut oil use working well  Right hand fracture from fall in rehab now  Depression with PCP management of  medication  Mammogram due  P:   Reviewed health and wellness pertinent to exam  Aware of need to evaluate if vaginal bleeding  Continue with coconut oil use but use at hs for better absorption  Continue rehab as indicated  Continue follow up with MD regarding medication use  Patient will schedule  Pap smear: no   counseled on breast self exam, mammography screening, feminine hygiene, adequate intake of calcium and vitamin D, diet and exercise  return annually or prn  An After Visit Summary was printed and given to the patient.

## 2018-04-05 NOTE — Patient Instructions (Signed)

## 2019-04-10 ENCOUNTER — Ambulatory Visit: Payer: BC Managed Care – PPO | Admitting: Certified Nurse Midwife

## 2019-07-02 IMAGING — CR DG WRIST COMPLETE 3+V*R*
4 series · 4 of 4 positions shown · non-contrast
Comparison: None.

CLINICAL DATA: Wrist pain after fall

EXAM:
RIGHT WRIST - COMPLETE 3+ VIEW

[x wrist pa right]
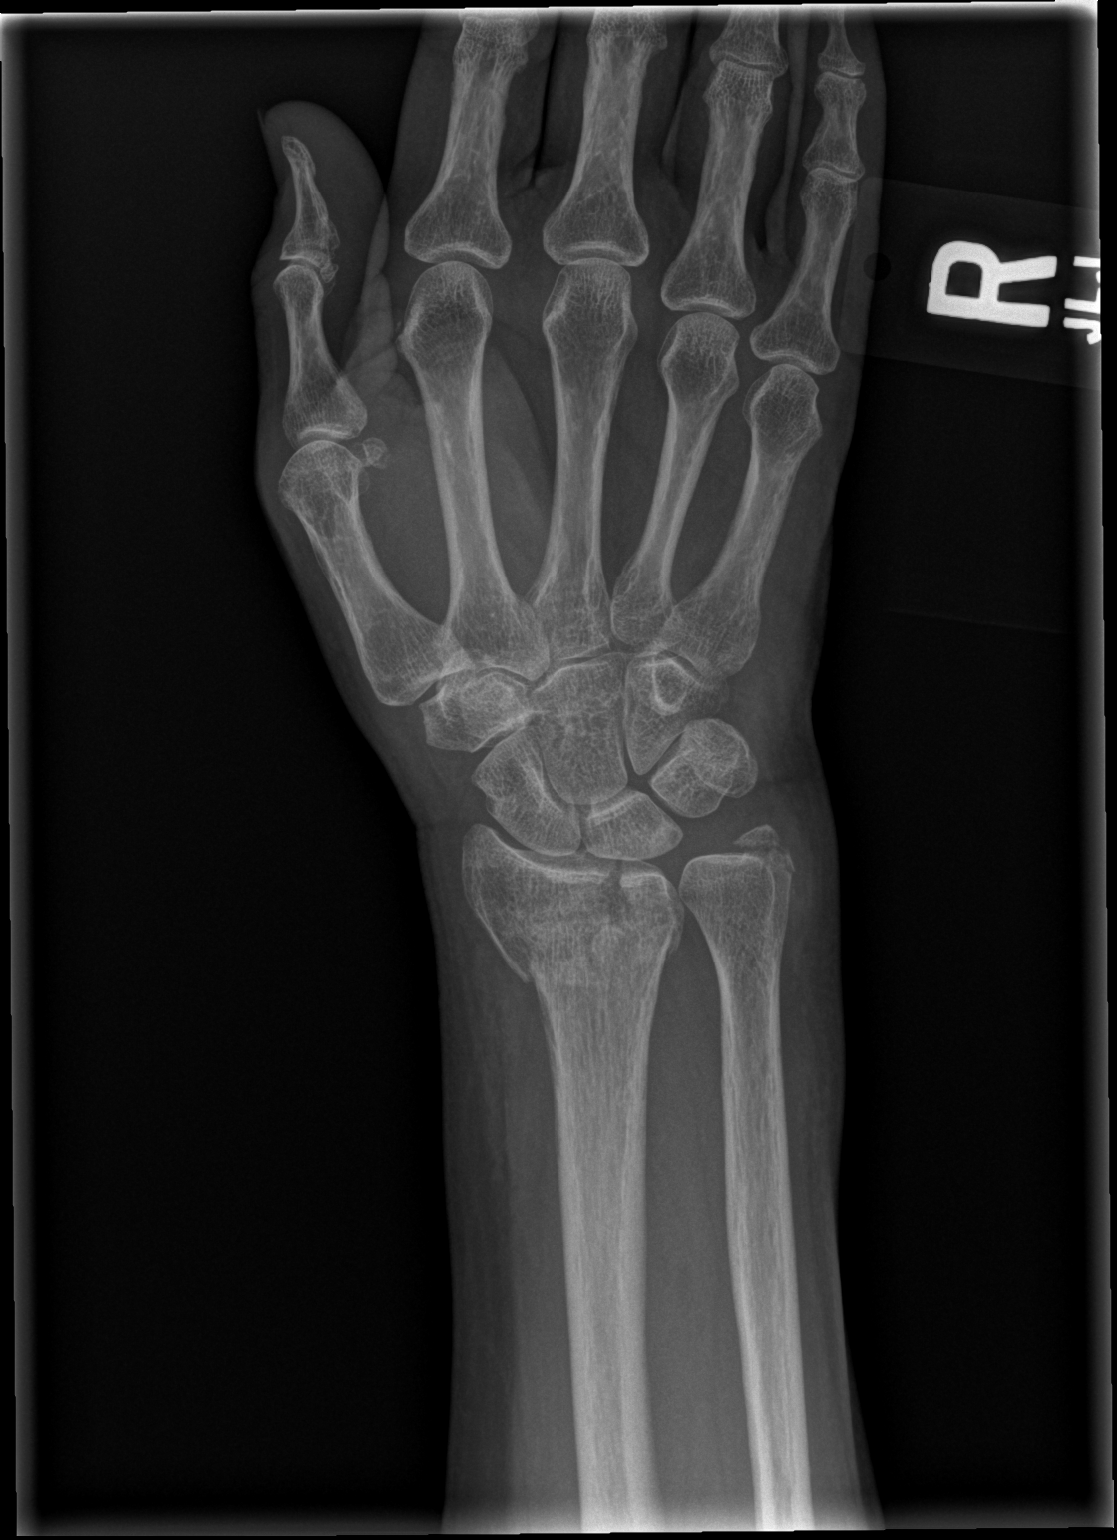

[x wrist obl right]
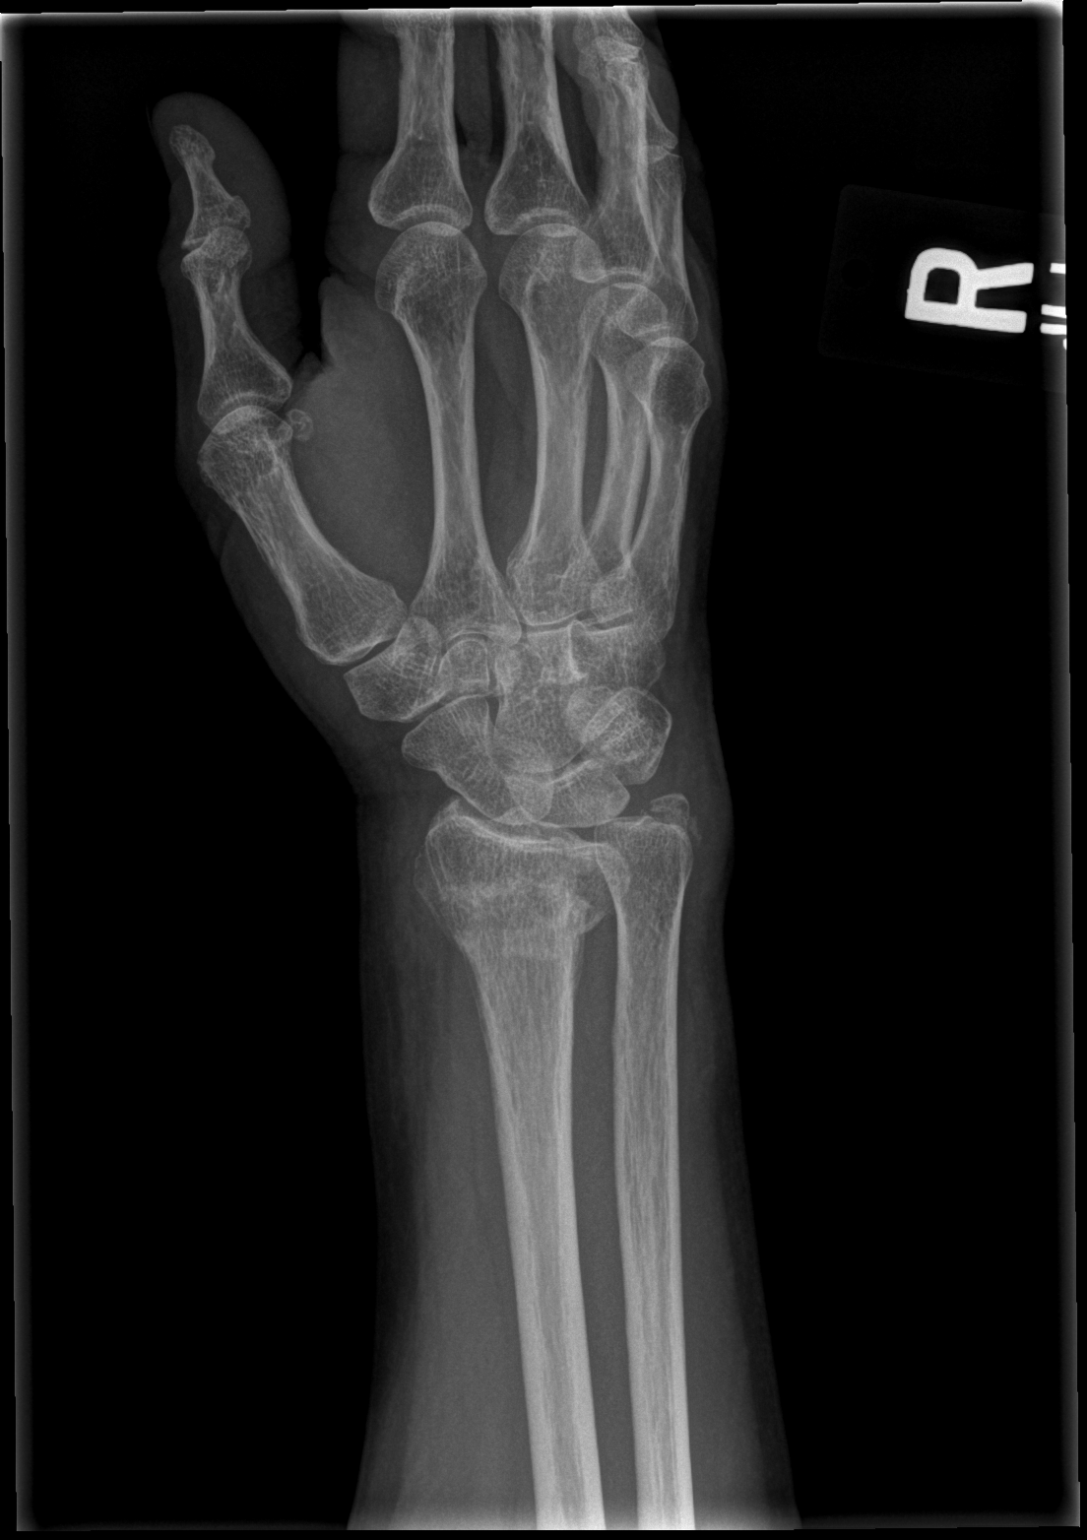

[x wrist lat right]
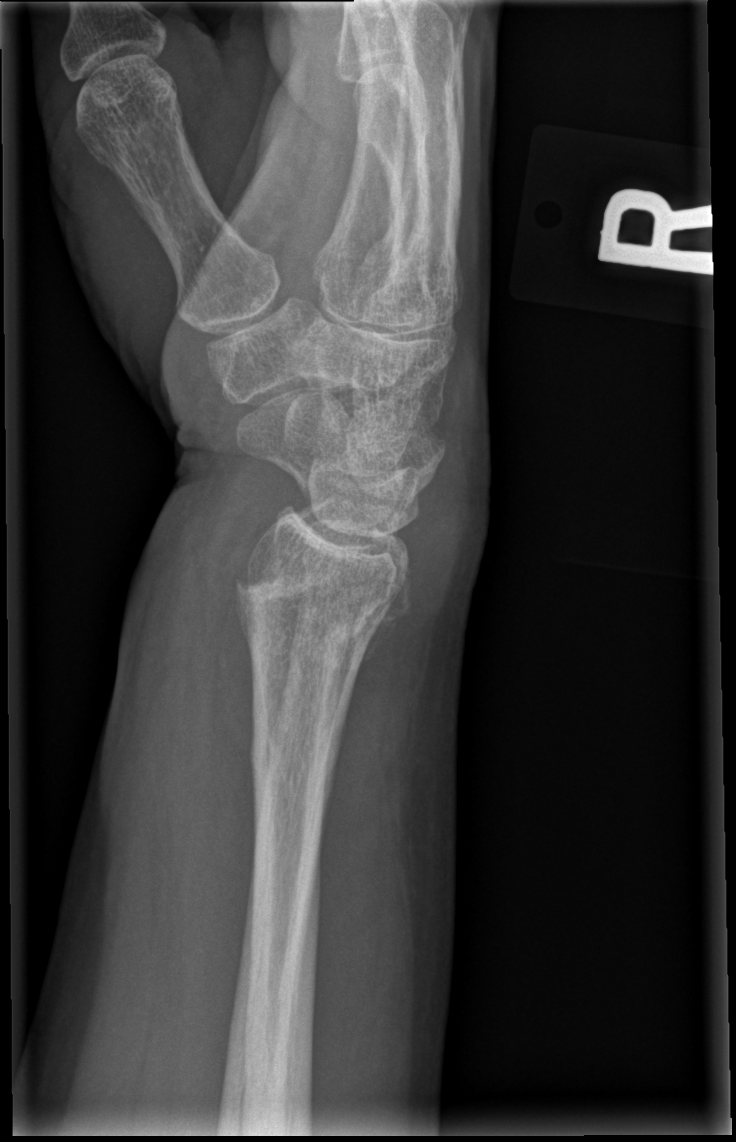

[x wrist navicular view right]
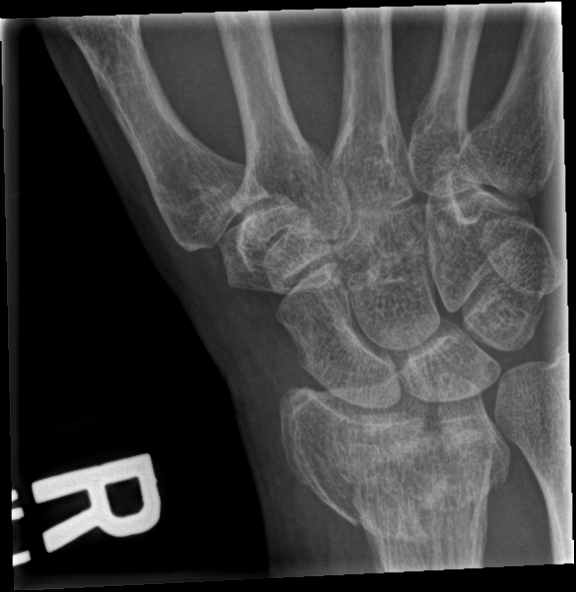

[4 of 4 positions shown; findings below may reference images not displayed]

FINDINGS: Comminuted impacted intra-articular fracture of the distal radius
with about [DATE] shaft diameter of dorsal displacement of distal
fracture fragment. Additional comminuted mildly displaced ulnar
styloid process fracture.
IMPRESSION: 1. Comminuted, impacted and mildly displaced intra-articular distal
radius fracture
2. Comminuted, mildly displaced ulnar styloid process fracture

## 2019-09-11 ENCOUNTER — Encounter: Payer: Self-pay | Admitting: Certified Nurse Midwife

## 2020-02-09 ENCOUNTER — Ambulatory Visit: Payer: BC Managed Care – PPO | Attending: Internal Medicine

## 2020-02-09 DIAGNOSIS — Z23 Encounter for immunization: Secondary | ICD-10-CM

## 2020-02-09 NOTE — Progress Notes (Signed)
   Covid-19 Vaccination Clinic  Name:  Leslie Herrera    MRN: [id number] DOB: 1956-07-25  02/09/2020  Ms. Leslie Herrera was observed post Covid-19 immunization for 15 minutes without incident. She was provided with Vaccine Information Sheet and instruction to access the V-Safe system.   Ms. Leslie Herrera was instructed to call 911 with any severe reactions post vaccine: Marland Kitchen Difficulty breathing  . Swelling of face and throat  . A fast heartbeat  . A bad rash all over body  . Dizziness and weakness   Immunizations Administered    Name Date Dose VIS Date Route   Pfizer COVID-19 Vaccine 02/09/2020 11:30 AM 0.3 mL 11/16/2019 Intramuscular   Manufacturer: [employer], [employer]   Lot: [id number]   NDC: [address]-5

## 2020-02-27 ENCOUNTER — Encounter: Payer: Self-pay | Admitting: Certified Nurse Midwife

## 2020-03-01 ENCOUNTER — Ambulatory Visit: Payer: BC Managed Care – PPO | Attending: Internal Medicine

## 2020-03-01 DIAGNOSIS — Z23 Encounter for immunization: Secondary | ICD-10-CM

## 2020-03-01 NOTE — Progress Notes (Signed)
   Covid-19 Vaccination Clinic  Name:  ZOSIA LUCCHESE    MRN: [id number] DOB: November 28, 1956  03/01/2020  Ms. Lefevers was observed post Covid-19 immunization for 15 minutes without incident. She was provided with Vaccine Information Sheet and instruction to access the V-Safe system.   Ms. Mersch was instructed to call 911 with any severe reactions post vaccine: Marland Kitchen Difficulty breathing  . Swelling of face and throat  . A fast heartbeat  . A bad rash all over body  . Dizziness and weakness   Immunizations Administered    Name Date Dose VIS Date Route   Pfizer COVID-19 Vaccine 03/01/2020 11:26 AM 0.3 mL 11/16/2019 Intramuscular   Manufacturer: [employer], [employer]   Lot: [id number]   NDC: [address]-7

## 2020-03-04 ENCOUNTER — Telehealth: Payer: Self-pay

## 2020-03-04 NOTE — Telephone Encounter (Signed)
Patient is in 04 recall from 09/2019 for breast u/s & was to have follow up in (12/2019). Per [facility], patient cancelled appointment because she doesn't feel the lump anymore. I called patient with no answer. I left a message for her to return phone call.

## 2020-03-13 NOTE — Telephone Encounter (Signed)
Left message for call back.

## 2020-03-28 NOTE — Telephone Encounter (Signed)
Letter to Dr.Jertson. 

## 2020-03-28 NOTE — Telephone Encounter (Signed)
No callback from patient. Routing to [employer], Armed forces technical officer.

## 2020-04-02 NOTE — Telephone Encounter (Signed)
Letter mailed to home address on file.  Dr.Jertson, okay to remove from recall? 

## 2020-04-02 NOTE — Telephone Encounter (Signed)
Yes, remove from recall. Encounter closed

## 2020-04-02 NOTE — Telephone Encounter (Signed)
Recall removed.  Encounter closed
